# Patient Record
Sex: Female | Born: 1975 | Race: White | Hispanic: No | Marital: Married | Smoking: Never smoker
Health system: Southern US, Community
[De-identification: ages and names within clinical notes are randomized; demographics above are authoritative.]

## PROBLEM LIST (undated history)

## (undated) DIAGNOSIS — N809 Endometriosis, unspecified: Secondary | ICD-10-CM

## (undated) DIAGNOSIS — F32A Depression, unspecified: Secondary | ICD-10-CM

## (undated) DIAGNOSIS — F419 Anxiety disorder, unspecified: Secondary | ICD-10-CM

## (undated) DIAGNOSIS — N941 Unspecified dyspareunia: Secondary | ICD-10-CM

## (undated) DIAGNOSIS — M797 Fibromyalgia: Secondary | ICD-10-CM

## (undated) DIAGNOSIS — I10 Essential (primary) hypertension: Secondary | ICD-10-CM

## (undated) DIAGNOSIS — N920 Excessive and frequent menstruation with regular cycle: Secondary | ICD-10-CM

## (undated) HISTORY — DX: Unspecified dyspareunia: N94.10

## (undated) HISTORY — DX: Excessive and frequent menstruation with regular cycle: N92.0

---

## 2010-04-03 ENCOUNTER — Ambulatory Visit: Payer: Self-pay | Admitting: Family Medicine

## 2011-03-19 ENCOUNTER — Ambulatory Visit: Payer: Self-pay | Admitting: Internal Medicine

## 2011-08-30 ENCOUNTER — Ambulatory Visit: Payer: Self-pay

## 2011-12-05 ENCOUNTER — Ambulatory Visit: Payer: Self-pay

## 2011-12-27 ENCOUNTER — Ambulatory Visit: Payer: Self-pay

## 2012-07-28 ENCOUNTER — Ambulatory Visit: Payer: Self-pay | Admitting: Internal Medicine

## 2012-07-28 LAB — COMPREHENSIVE METABOLIC PANEL
Anion Gap: 7 (ref 7–16)
BUN: 7 mg/dL (ref 7–18)
Calcium, Total: 9.1 mg/dL (ref 8.5–10.1)
Co2: 27 mmol/L (ref 21–32)
Creatinine: 0.89 mg/dL (ref 0.60–1.30)
EGFR (African American): >60
EGFR (Non-African Amer.): >60
SGOT(AST): 20 U/L (ref 15–37)
SGPT (ALT): 18 U/L (ref 12–78)
Total Protein: 7.5 g/dL (ref 6.4–8.2)

## 2012-07-28 LAB — CBC WITH DIFFERENTIAL/PLATELET
Basophil #: 0.1 10*3/uL (ref 0.0–0.1)
Basophil %: 0.6 %
Eosinophil #: 0.2 10*3/uL (ref 0.0–0.7)
HCT: 40.3 % (ref 35.0–47.0)
HGB: 13.6 g/dL (ref 12.0–16.0)
MCH: 31.2 pg (ref 26.0–34.0)
MCHC: 33.7 g/dL (ref 32.0–36.0)
Monocyte #: 0.5 x10 3/mm (ref 0.2–0.9)
Monocyte %: 6.3 %
Neutrophil #: 5 10*3/uL (ref 1.4–6.5)
Neutrophil %: 58.3 %
RBC: 4.35 10*6/uL (ref 3.80–5.20)
RDW: 13.1 % (ref 11.5–14.5)

## 2012-07-28 LAB — URINALYSIS, COMPLETE
Bilirubin,UR: NEGATIVE
Ketone: NEGATIVE
Ph: 6 (ref 4.5–8.0)
Protein: NEGATIVE
Specific Gravity: 1.005 (ref 1.003–1.030)

## 2012-07-29 LAB — URINE CULTURE

## 2014-08-29 ENCOUNTER — Ambulatory Visit: Payer: Self-pay

## 2014-08-29 LAB — OCCULT BLOOD X 1 CARD TO LAB, STOOL: Occult Blood, Feces: NEGATIVE

## 2014-09-07 ENCOUNTER — Ambulatory Visit: Payer: Self-pay

## 2014-09-09 ENCOUNTER — Ambulatory Visit: Payer: Self-pay

## 2014-11-22 DIAGNOSIS — N83209 Unspecified ovarian cyst, unspecified side: Secondary | ICD-10-CM

## 2014-11-22 HISTORY — DX: Unspecified ovarian cyst, unspecified side: N83.209

## 2017-02-01 ENCOUNTER — Ambulatory Visit
Admission: EM | Admit: 2017-02-01 | Discharge: 2017-02-01 | Disposition: A | Discharge status: Home / Self Care | Payer: Self-pay | Attending: Family Medicine | Admitting: Family Medicine

## 2017-02-01 ENCOUNTER — Encounter: Payer: Self-pay | Admitting: *Deleted

## 2017-02-01 DIAGNOSIS — J0100 Acute maxillary sinusitis, unspecified: Secondary | ICD-10-CM

## 2017-02-01 DIAGNOSIS — J0110 Acute frontal sinusitis, unspecified: Secondary | ICD-10-CM

## 2017-02-01 DIAGNOSIS — H9203 Otalgia, bilateral: Secondary | ICD-10-CM

## 2017-02-01 HISTORY — DX: Fibromyalgia: M79.7

## 2017-02-01 HISTORY — DX: Anxiety disorder, unspecified: F41.9

## 2017-02-01 HISTORY — DX: Endometriosis, unspecified: N80.9

## 2017-02-01 MED ORDER — AMOXICILLIN-POT CLAVULANATE 400-57 MG/5ML PO SUSR
880.0000 mg | Freq: Two times a day (BID) | ORAL | 0 refills | Status: AC
Start: 2017-02-03 — End: 2017-02-13

## 2017-02-01 NOTE — ED Provider Notes (Signed)
[facility] ____________________________________________  Time seen: Approximately 1:10 PM  I have reviewed the triage vital signs and the nursing notes.   HISTORY  Chief Complaint Otalgia; Cough; and Headache  HPI Elizabeth Lucas is a 41 y.o. female presents with spouse at bedside for complaints of 5-6 days of runny nose, nasal congestion, bilateral ear discomfort, mild sore throat, and sinus pressure. States some post nasal drainage. Reports some chills sensation, denies and fevers. Denies known sick contacts. Denies seasonal allergies. Reports some muffled hearing, denies tinnitus or ear drainage. Reports symptoms unresolved with over-the-counter TheraFlu and Mucinex.  Denies chest pain, shortness of breath, abdominal pain, dysuria, extremity pain, extremity swelling or rash. Denies recent sickness. Denies recent antibiotic use.   Patient's last menstrual period was 01/16/2017.: Denies pregnancy.  Rayetta HumphreyGeorge, Sionne A, MD: PCP   Past Medical History:  Diagnosis Date  . Anxiety   . Endometriosis   . Fibromyalgia     There are no active problems to display for this patient.   History reviewed. No pertinent surgical history.   No current facility-administered medications for this encounter.   Current Outpatient Prescriptions:  .  [START ON 02/03/2017] amoxicillin-clavulanate (AUGMENTIN) 400-57 MG/5ML suspension, Take 11 mLs (880 mg total) by mouth 2 (two) times daily., Disp: 220 mL, Rfl: 0  Allergies Percocet [oxycodone-acetaminophen]  History reviewed. No pertinent family history.  Social History Social History  Substance Use Topics  . Smoking status: Never Smoker  . Smokeless tobacco: Never Used  . Alcohol use Yes    Review of Systems Constitutional: As above.  Eyes: No visual changes. ENT: As above. Cardiovascular: Denies chest pain. Respiratory: Denies shortness of breath. Gastrointestinal: No abdominal pain.  No nausea, no vomiting.  No  diarrhea.  No constipation. Genitourinary: Negative for dysuria. Musculoskeletal: Negative for back pain. Skin: Negative for rash. Neurological: Negative for headaches, focal weakness or numbness.  10-point ROS otherwise negative.  ____________________________________________   PHYSICAL EXAM:  VITAL SIGNS: ED Triage Vitals  Enc Vitals Group     BP 02/01/17 1205 137/75     Pulse Rate 02/01/17 1205 (!) 101     Resp 02/01/17 1205 16     Temp 02/01/17 1205 98.3 F (36.8 C)     Temp Source 02/01/17 1205 Oral     SpO2 02/01/17 1205 98 %     Weight 02/01/17 1207 240 lb (108.9 kg)     Height 02/01/17 1207 5\' 4"  (1.626 m)     Head Circumference --      Peak Flow --      Pain Score 02/01/17 1212 6     Pain Loc --      Pain Edu? --      Excl. in GC? --     Constitutional: Alert and oriented. Well appearing and in no acute distress. Eyes: Conjunctivae are normal. PERRL. EOMI. Head: Atraumatic.Mild to moderate tenderness to palpation bilateral frontal and maxillary sinuses. No swelling. No erythema.   Ears: no erythema, normal TMs bilaterally. No surrounding tenderness, erythema or swelling bilaterally.  Nose: nasal congestion with bilateral nasal turbinate erythema and edema.   Mouth/Throat: Mucous membranes are moist.  Oropharynx non-erythematous.No tonsillar swelling or exudate.  Neck: No stridor.  No cervical spine tenderness to palpation. Hematological/Lymphatic/Immunilogical: No cervical lymphadenopathy. Cardiovascular: Normal rate, regular rhythm. Grossly normal heart sounds.  Good peripheral circulation. Respiratory: Normal respiratory effort.  No retractions.No wheezes, rales or rhonchi. Good air movement.  Gastrointestinal: Soft and nontender. No distention.  Musculoskeletal: Steady  gait. No cervical, thoracic or lumbar tenderness to palpation.  Neurologic:  Normal speech and language. No gait instability. Skin:  Skin is warm, dry and intact. No rash noted. Psychiatric:  Mood and affect are normal. Speech and behavior are normal.   ___________________________________________   LABS (all labs ordered are listed, but only abnormal results are displayed)  Labs Reviewed - No data to display  PROCEDURES Procedures   INITIAL IMPRESSION / ASSESSMENT AND PLAN / ED COURSE  Pertinent labs & imaging results that were available during my care of the patient were reviewed by me and considered in my medical decision making (see chart for details).   Well-appearing patient. No acute distress. Discussed evaluation of quick strep, patient declined. Discussed with patient antibiotic usage. Discussed the patient suspect viral sinusitis in counseled regarding supportive care, nasal saline rinses and over-the-counter Sudafed. Discussed with patient if symptoms continue for another 2-3 days, consideration of antibiotic use and Rx for Augmentin postdated given. Patient agreed to plan and states she will attempt supportive care. Discussed follow-up for reevaluation for any worsening concerns.Discussed indication, risks and benefits of medications with patient.  Discussed follow up with Primary care physician this week. Discussed follow up and return parameters including no resolution or any worsening concerns. Patient verbalized understanding and agreed to plan.   ____________________________________________   FINAL CLINICAL IMPRESSION(S) / ED DIAGNOSES  Final diagnoses:  Acute frontal sinusitis, recurrence not specified  Acute maxillary sinusitis, recurrence not specified  Ear pain, bilateral     Discharge Medication List as of 02/01/2017  1:21 PM    START taking these medications   Details  amoxicillin-clavulanate (AUGMENTIN) 400-57 MG/5ML suspension Take 11 mLs (880 mg total) by mouth 2 (two) times daily., Starting Thu 02/03/2017, Until Sun 02/13/2017, Normal        Note: This dictation was prepared with Dragon dictation along with smaller phrase technology. Any  transcriptional errors that result from this process are unintentional.         Renford Dills, NP 02/01/17 1402

## 2017-02-01 NOTE — Discharge Instructions (Signed)
Take medication as prescribed. Rest. Drink plenty of fluids.  ° °Follow up with your primary care physician this week as needed. Return to Urgent care for new or worsening concerns.  ° °

## 2017-02-01 NOTE — ED Triage Notes (Signed)
Patient started having symptoms of headache, cough, bilateral ear pain, and nasal congestion 5 days ago.

## 2017-04-13 ENCOUNTER — Other Ambulatory Visit: Payer: Self-pay | Admitting: Family Medicine

## 2017-04-13 DIAGNOSIS — Z1231 Encounter for screening mammogram for malignant neoplasm of breast: Secondary | ICD-10-CM

## 2017-04-20 ENCOUNTER — Ambulatory Visit
Admission: RE | Admit: 2017-04-20 | Discharge: 2017-04-20 | Disposition: A | Discharge status: Home / Self Care | Payer: BLUE CROSS/BLUE SHIELD | Source: Ambulatory Visit | Attending: Family Medicine | Admitting: Family Medicine

## 2017-04-20 DIAGNOSIS — Z1231 Encounter for screening mammogram for malignant neoplasm of breast: Secondary | ICD-10-CM | POA: Insufficient documentation

## 2017-11-01 ENCOUNTER — Other Ambulatory Visit: Payer: Self-pay

## 2017-11-01 ENCOUNTER — Ambulatory Visit
Admission: EM | Admit: 2017-11-01 | Discharge: 2017-11-01 | Disposition: A | Discharge status: Home / Self Care | Payer: BLUE CROSS/BLUE SHIELD | Attending: Family Medicine | Admitting: Family Medicine

## 2017-11-01 ENCOUNTER — Encounter: Payer: Self-pay | Admitting: *Deleted

## 2017-11-01 DIAGNOSIS — J029 Acute pharyngitis, unspecified: Secondary | ICD-10-CM

## 2017-11-01 DIAGNOSIS — R05 Cough: Secondary | ICD-10-CM

## 2017-11-01 DIAGNOSIS — J069 Acute upper respiratory infection, unspecified: Secondary | ICD-10-CM | POA: Diagnosis not present

## 2017-11-01 LAB — RAPID STREP SCREEN (MED CTR MEBANE ONLY): STREPTOCOCCUS, GROUP A SCREEN (DIRECT): NEGATIVE

## 2017-11-01 MED ORDER — BENZONATATE 200 MG PO CAPS: ORAL_CAPSULE | ORAL | 0 refills | Status: DC

## 2017-11-01 MED ORDER — ALBUTEROL SULFATE HFA 108 (90 BASE) MCG/ACT IN AERS: 1.0000 | INHALATION_SPRAY | Freq: Four times a day (QID) | RESPIRATORY_TRACT | 0 refills | Status: DC | PRN

## 2017-11-01 MED ORDER — HYDROCOD POLST-CPM POLST ER 10-8 MG/5ML PO SUER: 5.0000 mL | Freq: Two times a day (BID) | ORAL | 0 refills | Status: DC

## 2017-11-01 MED ORDER — FLUTICASONE PROPIONATE 50 MCG/ACT NA SUSP: 2.0000 | Freq: Every day | NASAL | 0 refills | Status: DC

## 2017-11-01 NOTE — Discharge Instructions (Signed)
Use of Flonase daily for the next 3 weeks.   If You run high fevers or have a productive cough  with shortness of breath return to our clinic or see your primary care physician

## 2017-11-01 NOTE — ED Triage Notes (Signed)
Patient started having symptoms of sore throat, cough, nasal congestion, and SOB 5 days ago.

## 2017-11-01 NOTE — ED Provider Notes (Signed)
[facility]    CSN: [id number] Arrival date & time: 11/01/17  1035     History   Chief Complaint Chief Complaint  Patient presents with  . Cough    APPT  . Nasal Congestion  . Sore Throat    HPI Elizabeth Lucas is a 41 y.o. female.   HPI  41 year old female presents with symptoms of sore throat cough nasal congestion and shortness of breath that began 5 days ago.  Had a fever but has had some chills.  Non-smoker.  She states that the cough is nonproductive at the present time.  Been taking over-the-counter medications which have not been relieving her symptoms.    Past Medical History:  Diagnosis Date  . Anxiety   . Endometriosis   . Fibromyalgia     There are no active problems to display for this patient.   History reviewed. No pertinent surgical history.  OB History    No data available       Home Medications    Prior to Admission medications   Medication Sig Start Date End Date Taking? Authorizing Provider  albuterol (PROVENTIL HFA;VENTOLIN HFA) 108 (90 Base) MCG/ACT inhaler Inhale 1-2 puffs into the lungs every 6 (six) hours as needed for wheezing or shortness of breath. Use with spacer 11/01/17   Lutricia Feiloemer, Derald Lorge P, PA-C  benzonatate (TESSALON) 200 MG capsule Take one cap TID PRN cough 11/01/17   Lutricia Feiloemer, Fayette Gasner P, PA-C  chlorpheniramine-HYDROcodone [facility](TUSSIONEX PENNKINETIC ER) 10-8 MG/5ML SUER Take 5 mLs by mouth 2 (two) times daily. 11/01/17   Lutricia Feiloemer, Clemente Dewey P, PA-C  fluticasone (FLONASE) 50 MCG/ACT nasal spray Place 2 sprays into both nostrils daily. 11/01/17   Lutricia Feiloemer, Allon Costlow P, PA-C    Family History Family History  Problem Relation Age of Onset  . Breast cancer Neg Hx     Social History Social History   Tobacco Use  . Smoking status: Never Smoker  . Smokeless tobacco: Never Used  Substance Use Topics  . Alcohol use: Yes  . Drug use: No     Allergies   Percocet [oxycodone-acetaminophen]   Review of  Systems Review of Systems  Constitutional: Positive for activity change and chills. Negative for fatigue and fever.  HENT: Positive for congestion, postnasal drip, rhinorrhea, sinus pressure and sinus pain.   Respiratory: Positive for cough and shortness of breath.   All other systems reviewed and are negative.    Physical Exam Triage Vital Signs ED Triage Vitals  Enc Vitals Group     BP 11/01/17 1047 116/68     Pulse Rate 11/01/17 1047 93     Resp 11/01/17 1047 18     Temp 11/01/17 1047 98.6 F (37 C)     Temp Source 11/01/17 1047 Oral     SpO2 11/01/17 1047 98 %     Weight 11/01/17 1048 250 lb (113.4 kg)     Height 11/01/17 1048 5\' 4"  (1.626 m)     Head Circumference --      Peak Flow --      Pain Score 11/01/17 1050 0     Pain Loc --      Pain Edu? --      Excl. in GC? --    No data found.  Updated Vital Signs BP 116/68 (BP Location: Left Arm)   Pulse 93   Temp 98.6 F (37 C) (Oral)   Resp 18   Ht 5\' 4"  (1.626 m)   Wt 250 lb (113.4  kg)   LMP 10/19/2017   SpO2 98%   BMI 42.91 kg/m   Visual Acuity Right Eye Distance:   Left Eye Distance:   Bilateral Distance:    Right Eye Near:   Left Eye Near:    Bilateral Near:     Physical Exam  Constitutional: She is oriented to person, place, and time. She appears well-developed and well-nourished.  HENT:  Head: Normocephalic.  Right Ear: Hearing and tympanic membrane normal. No middle ear effusion.  Left Ear: Hearing and tympanic membrane normal.  No middle ear effusion.  Mouth/Throat: Oropharynx is clear and moist and mucous membranes are normal. Tonsils are 0 on the right. Tonsils are 0 on the left. No tonsillar exudate.  Eyes: Pupils are equal, round, and reactive to light.  Neck: Normal range of motion.  Pulmonary/Chest: Effort normal and breath sounds normal.  Lymphadenopathy:    She has no cervical adenopathy.  Neurological: She is alert and oriented to person, place, and time.  Skin: Skin is warm and  dry.  Psychiatric: She has a normal mood and affect. Her behavior is normal.  Nursing note and vitals reviewed.    UC Treatments / Results  Labs (all labs ordered are listed, but only abnormal results are displayed) Labs Reviewed  RAPID STREP SCREEN (NOT AT [facility])  CULTURE, GROUP A STREP [facility]    EKG  EKG Interpretation None       Radiology No results found.  Procedures Procedures (including critical care time)  Medications Ordered in UC Medications - No data to display   Initial Impression / Assessment and Plan / UC Course  I have reviewed the triage vital signs and the nursing notes.  Pertinent labs & imaging results that were available during my care of the patient were reviewed by me and considered in my medical decision making (see chart for details).     Plan: 1. Test/x-ray results and diagnosis reviewed with patient 2. rx as per orders; risks, benefits, potential side effects reviewed with patient 3. Recommend supportive treatment with rest and fluids.  Flonase daily for the next 3 weeks.  If you develop high fevers productive cough shortness of breath follow-up with primary care or return to our clinic.  Use albuterol inhaler with spacer for shortness of breath 4. F/u prn if symptoms worsen or don't improve   Final Clinical Impressions(s) / UC Diagnoses   Final diagnoses:  Upper respiratory tract infection, unspecified type    ED Discharge Orders        Ordered    fluticasone (FLONASE) 50 MCG/ACT nasal spray  Daily     11/01/17 1159    chlorpheniramine-HYDROcodone (TUSSIONEX PENNKINETIC ER) 10-8 MG/5ML SUER  2 times daily     11/01/17 1159    benzonatate (TESSALON) 200 MG capsule     11/01/17 1159    albuterol (PROVENTIL HFA;VENTOLIN HFA) 108 (90 Base) MCG/ACT inhaler  Every 6 hours PRN    Comments:  Provide spacer and instructions to patient   11/01/17 1159       Controlled Substance Prescriptions [place] Controlled Substance Registry consulted?  Not Applicable   Lutricia FeilRoemer, Micaylah Bertucci P, PA-C 11/01/17 1208

## 2017-11-04 LAB — CULTURE, GROUP A STREP (THRC)

## 2017-12-15 ENCOUNTER — Other Ambulatory Visit: Payer: Self-pay

## 2017-12-15 ENCOUNTER — Encounter: Payer: Self-pay | Admitting: Emergency Medicine

## 2017-12-15 ENCOUNTER — Ambulatory Visit
Admission: EM | Admit: 2017-12-15 | Discharge: 2017-12-15 | Disposition: A | Discharge status: Home / Self Care | Payer: BLUE CROSS/BLUE SHIELD | Attending: Emergency Medicine | Admitting: Emergency Medicine

## 2017-12-15 DIAGNOSIS — B9689 Other specified bacterial agents as the cause of diseases classified elsewhere: Secondary | ICD-10-CM | POA: Diagnosis not present

## 2017-12-15 DIAGNOSIS — N771 Vaginitis, vulvitis and vulvovaginitis in diseases classified elsewhere: Secondary | ICD-10-CM

## 2017-12-15 DIAGNOSIS — N760 Acute vaginitis: Secondary | ICD-10-CM

## 2017-12-15 LAB — URINALYSIS, COMPLETE (UACMP) WITH MICROSCOPIC
Bilirubin Urine: NEGATIVE
Glucose, UA: NEGATIVE mg/dL
Hgb urine dipstick: NEGATIVE
KETONES UR: NEGATIVE mg/dL
LEUKOCYTES UA: NEGATIVE
Nitrite: NEGATIVE
PROTEIN: NEGATIVE mg/dL
RBC / HPF: NONE SEEN RBC/hpf (ref 0–5)
Specific Gravity, Urine: 1.01 (ref 1.005–1.030)
pH: 7 (ref 5.0–8.0)

## 2017-12-15 LAB — WET PREP, GENITAL
Sperm: NONE SEEN
TRICH WET PREP: NONE SEEN
Yeast Wet Prep HPF POC: NONE SEEN

## 2017-12-15 MED ORDER — NYSTATIN-TRIAMCINOLONE 100000-0.1 UNIT/GM-% EX CREA: 1.0000 "application " | TOPICAL_CREAM | Freq: Two times a day (BID) | CUTANEOUS | 0 refills | Status: AC

## 2017-12-15 MED ORDER — METRONIDAZOLE 500 MG PO TABS: 500.0000 mg | ORAL_TABLET | Freq: Two times a day (BID) | ORAL | 0 refills | Status: AC

## 2017-12-15 NOTE — Discharge Instructions (Signed)
I am not starting you on antibiotics for a urinary tract infection until we get the culture results back because I am starting you on 2 new medicines for your symptoms.  We will contact you and start you on the appropriate antibiotics if your urine culture consult positive for UTI.  This may take several days.  You also may call up here and get your results.  The meantime, finish the Flagyl unless your doctor tells you to stop.  Try the triamcinolone/nystatin cream.  Apply twice a day sparingly for a week.  May stop earlier than this if your symptoms resolve.

## 2017-12-15 NOTE — ED Triage Notes (Signed)
Patient c/o increase in urinary urgency and frequency for the past 3-4 days.  Patient reports some urinary pressure.

## 2017-12-15 NOTE — ED Provider Notes (Addendum)
HPI  SUBJECTIVE:  Elizabeth Lucas is a 42 y.o. female who presents with 3-4 days of urinary frequency, urgency, low abdominal pressure when urinating and some thereafter, and vulvar irritation.  She states that it feels as if her vulva are inflamed.  She tried increasing fluids and putting an ice cold washcloth on her genitals last night with some improvement in her symptoms.  No aggravating factors.  She denies fevers, body aches, abdominal, back, pelvic pain.  No dysuria, cloudy odorous urine, hematuria.  No vaginal itching, burning.  No genital rash, vaginal odor, bleeding, discharge.  She is in a long-term monogamous relationship with her husband who is asymptomatic.  STDs are not a concern today.  No antibiotics in the past month.  No perfumed soaps or body washes.  Denies using sanitary pads.  States that she is using a new toilet paper.  No antipyretic in the past 6-8 hours.  She has a Past medical history of fibromyalgia, pyelonephritis 20 years ago. no history of nephrolithiasis.  Also history of vaginal yeast infections.  No history of gonorrhea, chlamydia, herpes, HIV, syphilis, Trichomonas, BV, diabetes, hypertension.  LMP: 3 weeks ago.  She denies the possibility of being pregnant.  ZOX:WRUEAV, Marylin Crosby, MD   Past Medical History:  Diagnosis Date  . Anxiety   . Endometriosis   . Fibromyalgia     History reviewed. No pertinent surgical history.  Family History  Problem Relation Age of Onset  . Breast cancer Neg Hx     Social History   Tobacco Use  . Smoking status: Never Smoker  . Smokeless tobacco: Never Used  Substance Use Topics  . Alcohol use: Yes  . Drug use: No    No current facility-administered medications for this encounter.   Current Outpatient Medications:  .  fluticasone (FLONASE) 50 MCG/ACT nasal spray, Place 2 sprays into both nostrils daily., Disp: 16 g, Rfl: 0 .  metroNIDAZOLE (FLAGYL) 500 MG tablet, Take 1 tablet (500 mg total) by mouth 2 (two)  times daily for 7 days., Disp: 14 tablet, Rfl: 0 .  nystatin-triamcinolone (MYCOLOG II) cream, Apply 1 application topically 2 (two) times daily for 7 days. Apply sparingly, Disp: 15 g, Rfl: 0  Allergies  Allergen Reactions  . Percocet [Oxycodone-Acetaminophen] Nausea And Vomiting     ROS  As noted in HPI.   Physical Exam  BP 131/78 (BP Location: Left Arm)   Pulse (!) 106   Temp 98.2 F (36.8 C) (Oral)   Resp 16   Ht 5\' 4"  (1.626 m)   Wt 250 lb (113.4 kg)   LMP 11/24/2017 (Approximate)   SpO2 100%   BMI 42.91 kg/m   Constitutional: Well developed, well nourished, no acute distress Eyes:  EOMI, conjunctiva normal bilaterally HENT: Normocephalic, atraumatic,mucus membranes moist Respiratory: Normal inspiratory effort Cardiovascular: Normal rate GI: nondistended.  Mild suprapubic tenderness.  No flank tenderness. Back: No CVAT GU: Patient declined skin: No rash, skin intact Musculoskeletal: no deformities Neurologic: Alert & oriented x 3, no focal neuro deficits Psychiatric: Speech and behavior appropriate   ED Course   Medications - No data to display  Orders Placed This Encounter  Procedures  . Wet prep, genital    Standing Status:   Standing    Number of Occurrences:   1  . Urine culture    Standing Status:   Standing    Number of Occurrences:   1  . Urinalysis, Complete w Microscopic    Standing Status:  Standing    Number of Occurrences:   1    Results for orders placed or performed during the hospital encounter of 12/15/17 (from the past 24 hour(s))  Urinalysis, Complete w Microscopic     Status: Abnormal   Collection Time: 12/15/17 10:16 AM  Result Value Ref Range   Color, Urine YELLOW YELLOW   APPearance CLEAR CLEAR   Specific Gravity, Urine 1.010 1.005 - 1.030   pH 7.0 5.0 - 8.0   Glucose, UA NEGATIVE NEGATIVE mg/dL   Hgb urine dipstick NEGATIVE NEGATIVE   Bilirubin Urine NEGATIVE NEGATIVE   Ketones, ur NEGATIVE NEGATIVE mg/dL   Protein,  ur NEGATIVE NEGATIVE mg/dL   Nitrite NEGATIVE NEGATIVE   Leukocytes, UA NEGATIVE NEGATIVE   Squamous Epithelial / LPF 6-30 (A) NONE SEEN   WBC, UA 0-5 0 - 5 WBC/hpf   RBC / HPF NONE SEEN 0 - 5 RBC/hpf   Bacteria, UA FEW (A) NONE SEEN  Wet prep, genital     Status: Abnormal   Collection Time: 12/15/17 11:02 AM  Result Value Ref Range   Yeast Wet Prep HPF POC NONE SEEN NONE SEEN   Trich, Wet Prep NONE SEEN NONE SEEN   Clue Cells Wet Prep HPF POC PRESENT (A) NONE SEEN   WBC, Wet Prep HPF POC FEW (A) NONE SEEN   Sperm NONE SEEN    No results found.  ED Clinical Impression  Bacterial vaginosis  Vaginitis and vulvovaginitis   ED Assessment/Plan   Patient has BV.  Will treat this with Flagyl.  Suspect based on history that she has some sort of irritant dermatitis of the vulva, but she declined a pelvic exam.  Feel that she is low risk enough for STI's that this is reasonable.  Will try some nystatin/triamcinolone ointment.  Sending urine off for culture because of the suprapubic tenderness to confirm absence of a UTI.  Deferring antibiotic treatment until the culture is resulted.  She will follow-up with her primary care physician as needed.   Discussed labs,MDM, plan and followup with patient. patient agrees with plan.   Meds ordered this encounter  Medications  . metroNIDAZOLE (FLAGYL) 500 MG tablet    Sig: Take 1 tablet (500 mg total) by mouth 2 (two) times daily for 7 days.    Dispense:  14 tablet    Refill:  0  . nystatin-triamcinolone (MYCOLOG II) cream    Sig: Apply 1 application topically 2 (two) times daily for 7 days. Apply sparingly    Dispense:  15 g    Refill:  0    *This clinic note was created using Scientist, clinical (histocompatibility and immunogenetics)Dragon dictation software. Therefore, there may be occasional mistakes despite careful proofreading.   ?   Domenick GongMortenson, Chattie Greeson, MD 12/15/17 1142    Domenick GongMortenson, Bruin Bolger, MD 12/15/17 [phone]

## 2017-12-17 LAB — URINE CULTURE: Culture: 10000 — AB

## 2018-01-10 ENCOUNTER — Encounter: Payer: Self-pay | Admitting: *Deleted

## 2018-01-10 ENCOUNTER — Ambulatory Visit
Admission: EM | Admit: 2018-01-10 | Discharge: 2018-01-10 | Disposition: A | Discharge status: Home / Self Care | Payer: BLUE CROSS/BLUE SHIELD | Attending: Emergency Medicine | Admitting: Emergency Medicine

## 2018-01-10 ENCOUNTER — Other Ambulatory Visit: Payer: Self-pay

## 2018-01-10 DIAGNOSIS — J029 Acute pharyngitis, unspecified: Secondary | ICD-10-CM

## 2018-01-10 DIAGNOSIS — R1030 Lower abdominal pain, unspecified: Secondary | ICD-10-CM

## 2018-01-10 DIAGNOSIS — R350 Frequency of micturition: Secondary | ICD-10-CM

## 2018-01-10 DIAGNOSIS — R10819 Abdominal tenderness, unspecified site: Secondary | ICD-10-CM | POA: Diagnosis not present

## 2018-01-10 LAB — URINALYSIS, COMPLETE (UACMP) WITH MICROSCOPIC
Bilirubin Urine: NEGATIVE
Glucose, UA: NEGATIVE mg/dL
Ketones, ur: NEGATIVE mg/dL
Nitrite: NEGATIVE
Protein, ur: NEGATIVE mg/dL
Specific Gravity, Urine: <1.005 — ABNORMAL LOW (ref 1.005–1.030)
pH: 7 (ref 5.0–8.0)

## 2018-01-10 MED ORDER — CEPHALEXIN 250 MG/5ML PO SUSR: 500.0000 mg | Freq: Two times a day (BID) | ORAL | 0 refills | Status: AC

## 2018-01-10 MED ORDER — FLUCONAZOLE 150 MG PO TABS: 150.0000 mg | ORAL_TABLET | Freq: Once | ORAL | 0 refills | Status: AC

## 2018-01-10 NOTE — ED Provider Notes (Signed)
[facility]    CSN: [id number] Arrival date & time: 01/10/18  0905     History   Chief Complaint Chief Complaint  Patient presents with  . Urinary Urgency    HPI Elizabeth Lucas is a 42 y.o. female.   42 year old female presents with urinary frequency and urgency that started yesterday. Also having significant left lower quadrant and suprapubic pain last night. Took Tylenol and used a heating pad which did help. Also has noticed urine has a foul odor but denies any fever, hematuria or unusual vaginal discharge. Also started having a sore throat 2 days ago with chills. Denies any congestion, sinus pain, cough, or low back pain. She was seen almost 1 month ago here at Urgent Care for dysuria and vulvar irritation and was dx with BV. Took Flagyl which did help symptoms- although had a difficult time with pills since unable to swallow pills well. Also had urine culture performed which was positive for 10,000 colonies of E. Coli- sensitive to Rocephin and Macrobid. Did not receive any additional antibiotics at the time since symptoms were improving. Was negative for yeast and Trich. Declined STD testing at the time. 1 partner (husband) and LMP 1 month ago and regular. Today symptoms are more "urinary" compared to "vaginal" per patient. No other current health issues and takes no daily medication.    The history is provided by the patient.    Past Medical History:  Diagnosis Date  . Anxiety   . Endometriosis   . Fibromyalgia     There are no active problems to display for this patient.   History reviewed. No pertinent surgical history.  OB History    No data available       Home Medications    Prior to Admission medications   Medication Sig Start Date End Date Taking? Authorizing Provider  cephALEXin (KEFLEX) 250 MG/5ML suspension Take 10 mLs (500 mg total) by mouth 2 (two) times daily for 7 days. 01/10/18 01/17/18  Sudie Grumbling, NP    Family  History Family History  Problem Relation Age of Onset  . Healthy Mother   . Breast cancer Neg Hx     Social History Social History   Tobacco Use  . Smoking status: Never Smoker  . Smokeless tobacco: Never Used  Substance Use Topics  . Alcohol use: Yes  . Drug use: No     Allergies   Percocet [oxycodone-acetaminophen]   Review of Systems Review of Systems  Constitutional: Positive for appetite change, chills and fatigue. Negative for activity change and fever.  HENT: Positive for sore throat. Negative for congestion, ear discharge, ear pain, mouth sores, postnasal drip, sinus pressure, sinus pain and trouble swallowing.   Eyes: Negative for pain, discharge, redness and itching.  Respiratory: Negative for cough, chest tightness, shortness of breath and wheezing.   Gastrointestinal: Positive for abdominal pain. Negative for diarrhea, nausea and vomiting.  Genitourinary: Positive for decreased urine volume, dysuria, frequency and urgency. Negative for difficulty urinating, flank pain, hematuria, vaginal bleeding and vaginal discharge.  Musculoskeletal: Negative for arthralgias, back pain, myalgias, neck pain and neck stiffness.  Skin: Negative for rash and wound.  Allergic/Immunologic: Negative for immunocompromised state.  Neurological: Positive for headaches. Negative for dizziness, seizures, syncope, weakness and light-headedness.  Hematological: Negative for adenopathy. Does not bruise/bleed easily.     Physical Exam Triage Vital Signs ED Triage Vitals  Enc Vitals Group     BP 01/10/18 0923 117/66  Pulse Rate 01/10/18 0923 (!) 109     Resp 01/10/18 0923 16     Temp 01/10/18 0923 97.6 F (36.4 C)     Temp Source 01/10/18 0923 Oral     SpO2 01/10/18 0923 97 %     Weight --      Height --      Head Circumference --      Peak Flow --      Pain Score 01/10/18 0926 0     Pain Loc --      Pain Edu? --      Excl. in GC? --    No data found.  Updated Vital  Signs BP 117/66 (BP Location: Left Arm)   Pulse (!) 109   Temp 97.6 F (36.4 C) (Oral)   Resp 16   LMP 12/11/2017   SpO2 97%   Visual Acuity Right Eye Distance:   Left Eye Distance:   Bilateral Distance:    Right Eye Near:   Left Eye Near:    Bilateral Near:     Physical Exam  Constitutional: She is oriented to person, place, and time. She appears well-developed and well-nourished. No distress.  HENT:  Head: Normocephalic and atraumatic.  Right Ear: Hearing, tympanic membrane, external ear and ear canal normal.  Left Ear: Hearing, tympanic membrane, external ear and ear canal normal.  Nose: Nose normal. Right sinus exhibits no maxillary sinus tenderness and no frontal sinus tenderness. Left sinus exhibits no maxillary sinus tenderness and no frontal sinus tenderness.  Mouth/Throat: Uvula is midline and mucous membranes are normal. Posterior oropharyngeal erythema present.  Eyes: Conjunctivae and EOM are normal.  Neck: Normal range of motion. Neck supple.  Cardiovascular: Regular rhythm and normal heart sounds. Tachycardia present.  No murmur heard. Pulmonary/Chest: Effort normal and breath sounds normal. No respiratory distress. She has no decreased breath sounds. She has no wheezes. She has no rhonchi.  Abdominal: Soft. Normal appearance and bowel sounds are normal. She exhibits no distension and no mass. There is tenderness in the right lower quadrant, suprapubic area and left lower quadrant. There is no rigidity, no rebound, no guarding and no CVA tenderness.  Genitourinary:  Genitourinary Comments: Patient declined pelvic exam today  Musculoskeletal: Normal range of motion.  Lymphadenopathy:    She has cervical adenopathy.       Right cervical: Superficial cervical adenopathy present.       Left cervical: Superficial cervical adenopathy present.  Neurological: She is alert and oriented to person, place, and time.  Skin: Skin is warm and dry. No rash noted.  Psychiatric:  She has a normal mood and affect. Her behavior is normal. Judgment and thought content normal.     UC Treatments / Results  Labs (all labs ordered are listed, but only abnormal results are displayed) Labs Reviewed  URINALYSIS, COMPLETE (UACMP) WITH MICROSCOPIC - Abnormal; Notable for the following components:      Result Value   Color, Urine STRAW (*)    Specific Gravity, Urine <1.005 (*)    Hgb urine dipstick TRACE (*)    Leukocytes, UA MODERATE (*)    Squamous Epithelial / LPF 6-30 (*)    Bacteria, UA RARE (*)    All other components within normal limits  URINE CULTURE    EKG  EKG Interpretation None       Radiology No results found.  Procedures Procedures (including critical care time)  Medications Ordered in UC Medications - No data to display  Initial Impression / Assessment and Plan / UC Course  I have reviewed the triage vital signs and the nursing notes.  Pertinent labs & imaging results that were available during my care of the patient were reviewed by me and considered in my medical decision making (see chart for details).    Reviewed urinalysis results with patient- may be a UTI. Since requests liquid antibiotic and also having pharyngitis, recommend trial Keflex 500mg  twice a day as directed. Increase fluid intake and may continue Tylenol as needed for pain. May also use Diflucan 150mg  one time if symptoms of yeast vaginitis occur from antibiotic use. Patient declined pelvic exam and further testing for STD's today. If symptoms do not resolve or if they recur within 1 month, she will return for pelvic exam and further testing. Recommend abstain from sexual intercourse for the next 5 days. Follow-up pending urine culture results and with her PCP in 5 to 7 days if not resolving.   Final Clinical Impressions(s) / UC Diagnoses   Final diagnoses:  Urinary frequency  Suprapubic tenderness  Acute pharyngitis, unspecified etiology    ED Discharge Orders         Ordered    cephALEXin (KEFLEX) 250 MG/5ML suspension  2 times daily     01/10/18 1002    fluconazole (DIFLUCAN) 150 MG tablet   Once     01/10/18 1002       Controlled Substance Prescriptions [place] Controlled Substance Registry consulted? Not Applicable   Sudie Grumbling, NP 01/11/18 [phone]

## 2018-01-10 NOTE — Discharge Instructions (Signed)
Recommend start Keflex 500mg  twice a day as directed. Increase fluid intake. May use Diflucan 150mg  one time if symptoms of yeast vaginitis occur. Recommend abstain from sexual intercourse for the next 5 days. Follow-up pending urine culture results and with your PCP in 5 to 7 days if not resolving.

## 2018-01-10 NOTE — ED Triage Notes (Addendum)
PAtient started having symptoms of LLQ abdominal pain and urine odor yesterday. Patient also reports sore throat symptom started 2 days ago.

## 2019-04-10 ENCOUNTER — Other Ambulatory Visit: Payer: Self-pay

## 2019-04-10 ENCOUNTER — Encounter: Payer: Self-pay | Admitting: Obstetrics and Gynecology

## 2019-04-10 ENCOUNTER — Ambulatory Visit (INDEPENDENT_AMBULATORY_CARE_PROVIDER_SITE_OTHER): Payer: BLUE CROSS/BLUE SHIELD | Admitting: Obstetrics and Gynecology

## 2019-04-10 VITALS — BP 128/84 | Ht 64.0 in | Wt 257.0 lb

## 2019-04-10 DIAGNOSIS — N941 Unspecified dyspareunia: Secondary | ICD-10-CM

## 2019-04-10 DIAGNOSIS — N9489 Other specified conditions associated with female genital organs and menstrual cycle: Secondary | ICD-10-CM

## 2019-04-10 DIAGNOSIS — N949 Unspecified condition associated with female genital organs and menstrual cycle: Secondary | ICD-10-CM | POA: Diagnosis not present

## 2019-04-10 DIAGNOSIS — R300 Dysuria: Secondary | ICD-10-CM

## 2019-04-10 LAB — POCT URINALYSIS DIPSTICK
Bilirubin, UA: NEGATIVE
Blood, UA: NEGATIVE
Glucose, UA: NEGATIVE
Ketones, UA: NEGATIVE
Leukocytes, UA: NEGATIVE
Nitrite, UA: NEGATIVE
Protein, UA: NEGATIVE
Spec Grav, UA: 1.015 (ref 1.010–1.025)
Urobilinogen, UA: NEGATIVE E.U./dL — AB
pH, UA: 7 (ref 5.0–8.0)

## 2019-04-10 NOTE — Patient Instructions (Signed)
I value your feedback and entrusting us with your care. If you get a [facility] patient survey, I would appreciate you taking the time to let us know about your experience today. Thank you! 

## 2019-04-10 NOTE — Progress Notes (Signed)
Rayetta Humphrey, MD   Chief Complaint  Patient presents with  . Urinary Tract Infection  . Pelvic Pain    HPI:      [name] is a 43 y.o. No obstetric history on file. who LMP was Patient's last menstrual period was 03/30/2019., presents today for NP> 3 yrs eval of vaginal pain after sex with urinary urgency, lasting 2-3 days. Sx started 11/2017. Pt has decreased lubrication with sex and uses astroglide. Pt then has significant vag burning sx afterwards. Pt uses oil of olay soap (dove sens skin soap irritating to her) on skin but not vaginally, a vaginal pH soap, no dryer sheets, no change in detergent. Pt's husband uses oil of olay soap. Pt has been evaluated many times without identification of cause. Pt has been treated with abx and yeast without sx change. Pt denies any vag, urin sx if not sex active.   Hx of deep dyspareunia for 20 yrs, seen by Dr. Bonney Aid in past. He is concerned about endometriosis and recommended dx lap prn. Pt with LTO cyst in 2016, and recent dx of fibromyalgia and carpal tunnel.   Last pap 12/2016. Pt still has menses, usually Q1-2 months.   Past Medical History:  Diagnosis Date  . Anxiety   . Dyspareunia in female   . Endometriosis   . Fibromyalgia   . Menorrhagia   . Ovarian cyst 2016   LT    History reviewed. No pertinent surgical history.  Family History  Problem Relation Age of Onset  . Healthy Mother   . Colon cancer Maternal Grandmother   . Diabetes type II Maternal Grandfather   . Colon cancer Paternal Grandmother   . Breast cancer Neg Hx     Social History   Socioeconomic History  . Marital status: Married    Spouse name: Not on file  . Number of children: Not on file  . Years of education: Not on file  . Highest education level: Not on file  Occupational History  . Not on file  Social Needs  . Financial resource strain: Not on file  . Food insecurity:    Worry: Not on file    Inability: Not on file  .  Transportation needs:    Medical: Not on file    Non-medical: Not on file  Tobacco Use  . Smoking status: Never Smoker  . Smokeless tobacco: Never Used  Substance and Sexual Activity  . Alcohol use: Yes    Alcohol/week: 3.0 standard drinks    Types: 3 Glasses of wine per week  . Drug use: No  . Sexual activity: Yes    Birth control/protection: None  Lifestyle  . Physical activity:    Days per week: Not on file    Minutes per session: Not on file  . Stress: Not on file  Relationships  . Social connections:    Talks on phone: Not on file    Gets together: Not on file    Attends religious service: Not on file    Active member of club or organization: Not on file    Attends meetings of clubs or organizations: Not on file    Relationship status: Not on file  . Intimate partner violence:    Fear of current or ex partner: Not on file    Emotionally abused: Not on file    Physically abused: Not on file    Forced sexual activity: Not on file  Other Topics Concern  .  Not on file  Social History Narrative  . Not on file    No outpatient medications prior to visit.   No facility-administered medications prior to visit.      ROS:  Review of Systems  Constitutional: Negative for fatigue, fever and unexpected weight change.  Respiratory: Negative for cough, shortness of breath and wheezing.   Cardiovascular: Negative for chest pain, palpitations and leg swelling.  Gastrointestinal: Negative for blood in stool, constipation, diarrhea, nausea and vomiting.  Endocrine: Positive for heat intolerance. Negative for cold intolerance and polyuria.  Genitourinary: Positive for dysuria, frequency and vaginal pain. Negative for dyspareunia, flank pain, genital sores, hematuria, menstrual problem, pelvic pain, urgency, vaginal bleeding and vaginal discharge.  Musculoskeletal: Negative for back pain, joint swelling and myalgias.  Skin: Negative for rash.  Neurological: Negative for  dizziness, syncope, light-headedness, numbness and headaches.  Hematological: Negative for adenopathy.  Psychiatric/Behavioral: Negative for agitation, confusion, sleep disturbance and suicidal ideas. The patient is not nervous/anxious.     OBJECTIVE:   Vitals:  BP 128/84   Ht 5\' 4"  (1.626 m)   Wt 257 lb (116.6 kg)   LMP 03/30/2019   BMI 44.11 kg/m   Physical Exam Vitals signs reviewed.  Constitutional:      Appearance: She is well-developed.  Neck:     Musculoskeletal: Normal range of motion.  Pulmonary:     Effort: Pulmonary effort is normal.  Genitourinary:    General: Normal vulva.     Pubic Area: No rash.      Labia:        Right: No rash, tenderness or lesion.        Left: No rash, tenderness or lesion.   Musculoskeletal: Normal range of motion.  Skin:    General: Skin is warm and dry.  Neurological:     General: No focal deficit present.     Mental Status: She is alert and oriented to person, place, and time.  Psychiatric:        Mood and Affect: Mood normal.        Behavior: Behavior normal.        Thought Content: Thought content normal.        Judgment: Judgment normal.     Results: Results for orders placed or performed in visit on 04/10/19 (from the past 24 hour(s))  POCT Urinalysis Dipstick     Status: Abnormal   Collection Time: 04/10/19 10:59 AM  Result Value Ref Range   Color, UA yellow    Clarity, UA clear    Glucose, UA Negative Negative   Bilirubin, UA neg    Ketones, UA neg    Spec Grav, UA 1.015 1.010 - 1.025   Blood, UA neg    pH, UA 7.0 5.0 - 8.0   Protein, UA Negative Negative   Urobilinogen, UA negative (A) 0.2 or 1.0 E.U./dL   Nitrite, UA neg    Leukocytes, UA Negative Negative   Appearance     Odor       Assessment/Plan: Vaginal burning - Neg exam, sx only with sex. Question chem vs friction irritant. Use coconut oil as lubricant, dove sens skin for husband, cortisone-10 after sex. F/u prn.   Dyspareunia in female - Deep  sx for 20 yrs. Can f/u for dx lap conf with Dr. Bonney AidStaebler prn.   Dysuria - Neg UA. Due to ext vaginal irritation. - Plan: POCT Urinalysis Dipstick     Return if symptoms worsen or fail to improve.  Helmut MusterAlicia  B. Copland, PA-C 04/10/2019 11:30 AM

## 2021-01-26 ENCOUNTER — Other Ambulatory Visit: Payer: Self-pay | Admitting: Family Medicine

## 2021-01-26 DIAGNOSIS — Z1231 Encounter for screening mammogram for malignant neoplasm of breast: Secondary | ICD-10-CM

## 2021-02-04 ENCOUNTER — Other Ambulatory Visit: Payer: Self-pay

## 2021-02-04 ENCOUNTER — Ambulatory Visit
Admission: RE | Admit: 2021-02-04 | Discharge: 2021-02-04 | Disposition: A | Discharge status: Home / Self Care | Payer: BC Managed Care – PPO | Source: Ambulatory Visit | Attending: Family Medicine | Admitting: Family Medicine

## 2021-02-04 DIAGNOSIS — Z1231 Encounter for screening mammogram for malignant neoplasm of breast: Secondary | ICD-10-CM | POA: Insufficient documentation

## 2021-05-31 IMAGING — MG MM DIGITAL SCREENING BILAT W/ TOMO AND CAD
8 series · 8 of 24 positions shown · non-contrast
Comparison: Previous exam(s).

CLINICAL DATA: Screening.

EXAM:
DIGITAL SCREENING BILATERAL MAMMOGRAM WITH TOMOSYNTHESIS AND CAD
TECHNIQUE: Bilateral screening digital craniocaudal and mediolateral oblique
mammograms were obtained. Bilateral screening digital breast
tomosynthesis was performed. The images were evaluated with
computer-aided detection.

[L MLO synth-2D]
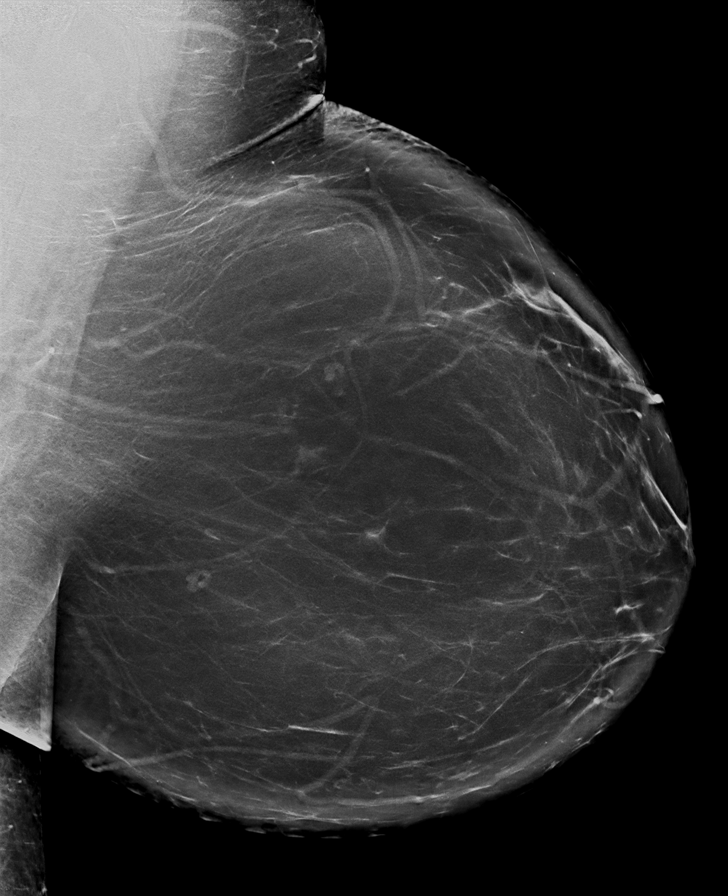

[L CC synth-2D]
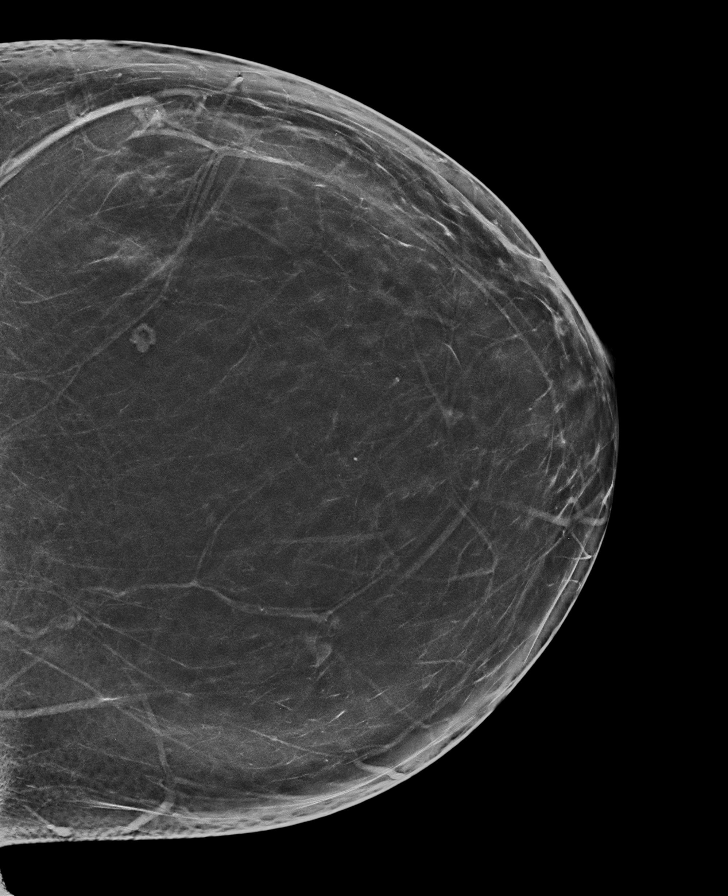

[R CC synth-2D]
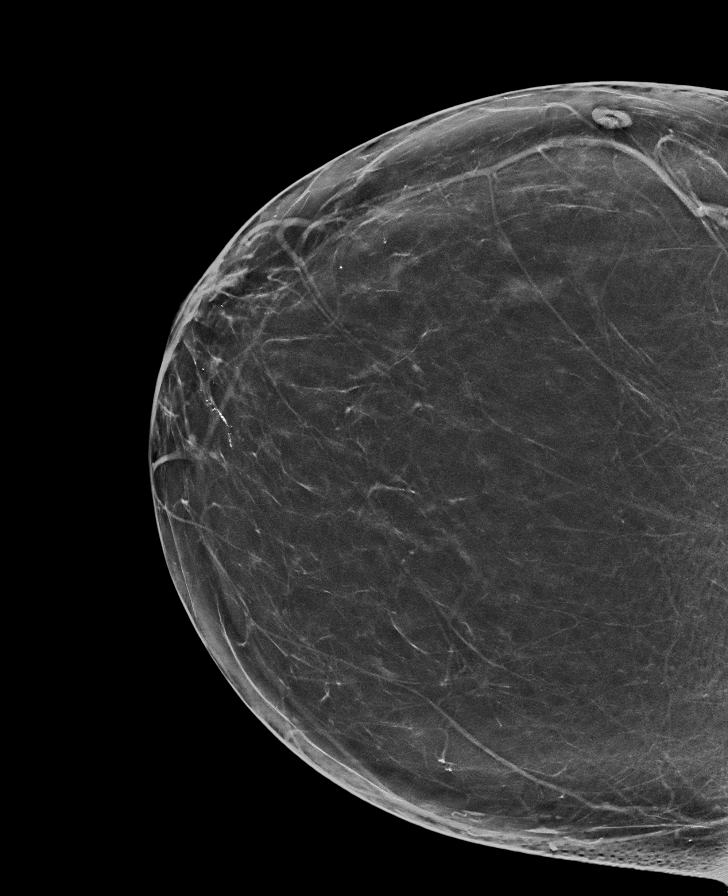

[R MLO synth-2D]
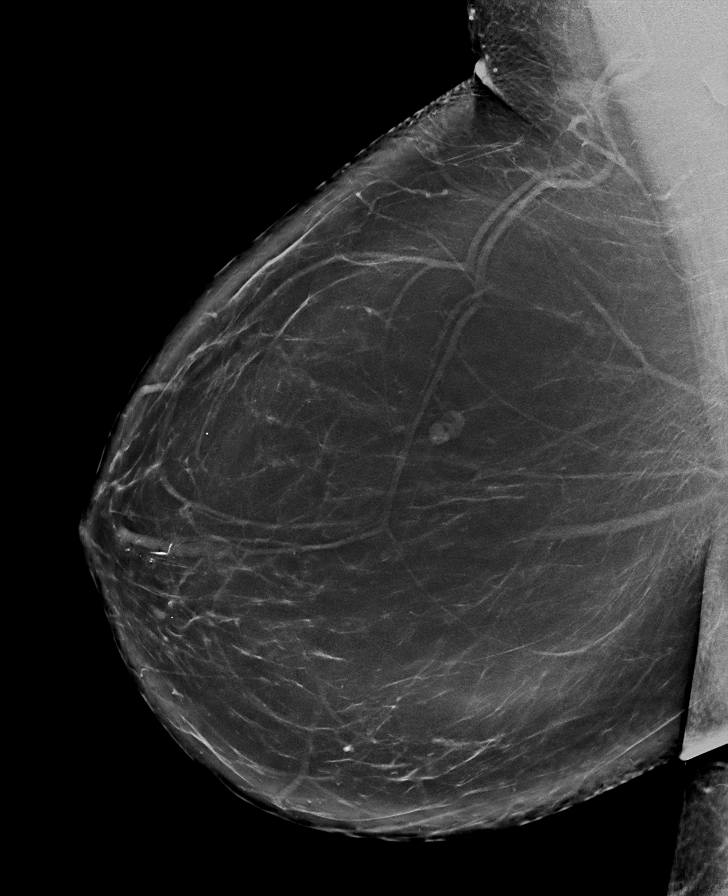

[R MLO tomo · tomo slice 53/104.0]
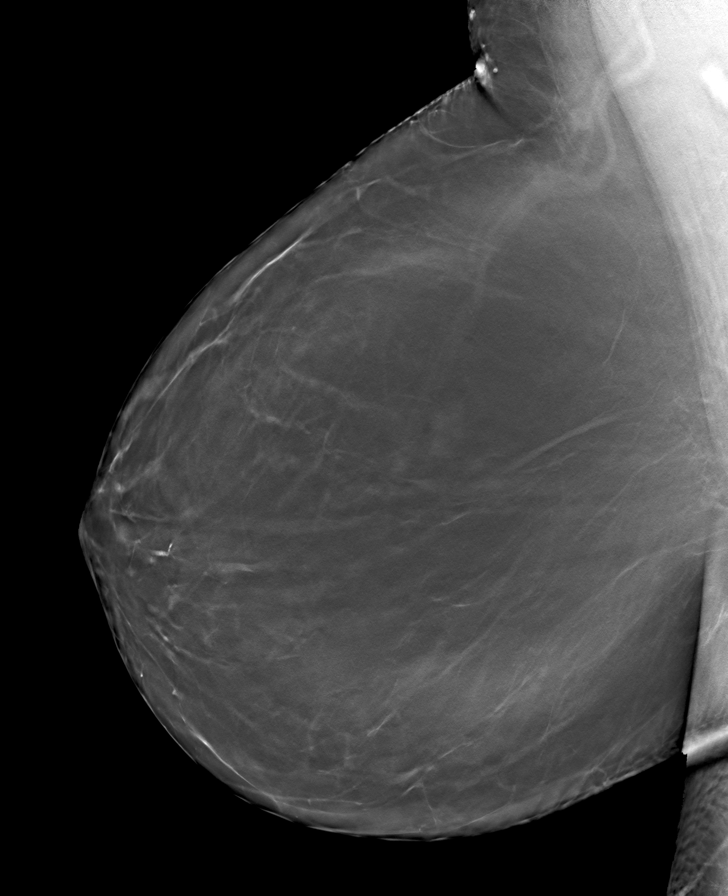

[L MLO tomo · tomo slice 57/113.0]
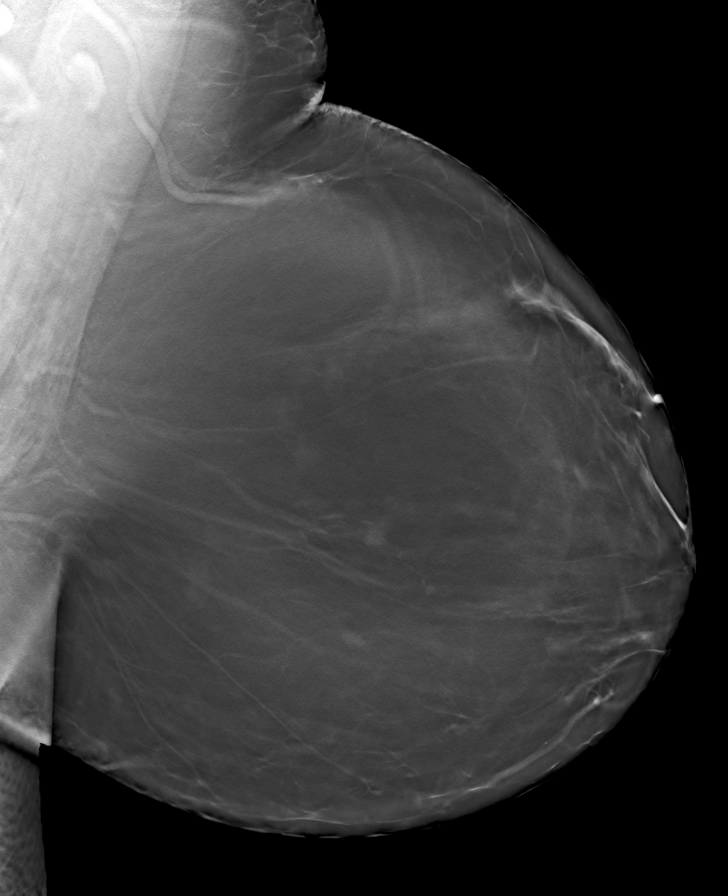

[L CC tomo · tomo slice 43/86.0]
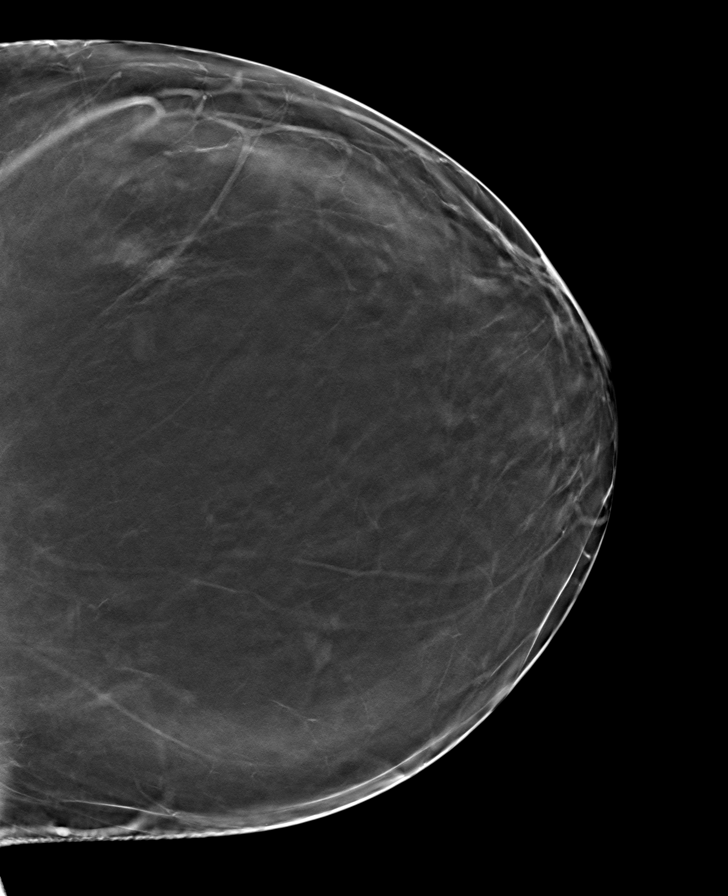

[R CC tomo · tomo slice 41/82.0]
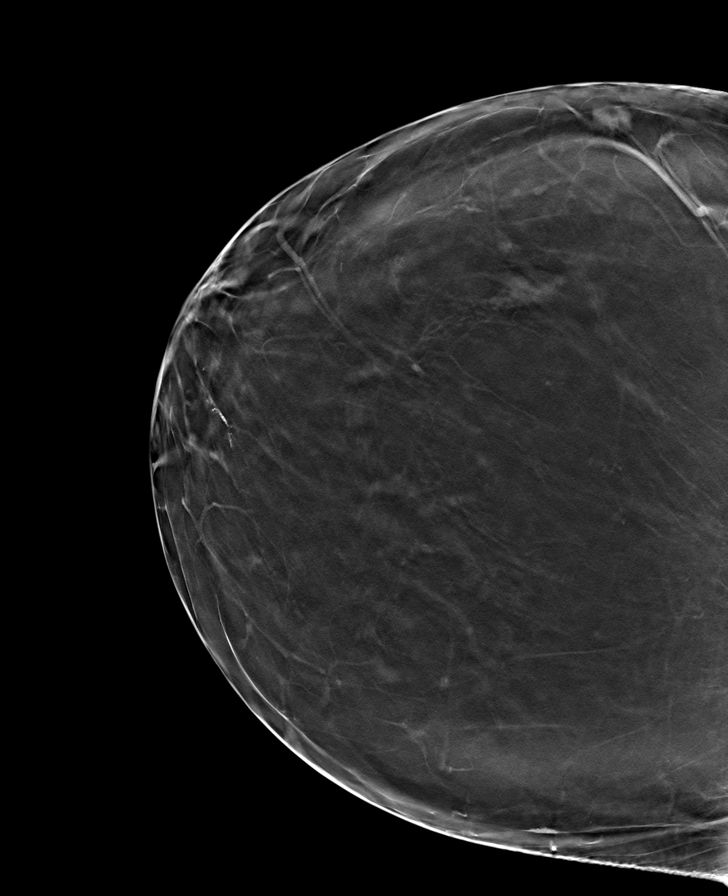

[8 of 24 positions shown; findings below may reference images not displayed]

ACR Breast Density Category b: There are scattered areas of
fibroglandular density.
FINDINGS: There are no findings suspicious for malignancy. The images were
evaluated with computer-aided detection.
IMPRESSION: No mammographic evidence of malignancy. A result letter of this
screening mammogram will be mailed directly to the patient.

RECOMMENDATION:
Screening mammogram in one year. (Code:WJ-I-BG6)

BI-RADS CATEGORY  1: Negative.

## 2021-12-10 ENCOUNTER — Other Ambulatory Visit: Payer: Self-pay

## 2021-12-10 ENCOUNTER — Other Ambulatory Visit: Payer: Self-pay | Admitting: Licensed Practical Nurse

## 2021-12-10 ENCOUNTER — Ambulatory Visit (INDEPENDENT_AMBULATORY_CARE_PROVIDER_SITE_OTHER): Payer: BC Managed Care – PPO | Admitting: Licensed Practical Nurse

## 2021-12-10 ENCOUNTER — Encounter: Payer: Self-pay | Admitting: Licensed Practical Nurse

## 2021-12-10 VITALS — BP 126/84 | Ht 64.0 in | Wt 268.0 lb

## 2021-12-10 DIAGNOSIS — N3945 Continuous leakage: Secondary | ICD-10-CM

## 2021-12-10 DIAGNOSIS — R102 Pelvic and perineal pain unspecified side: Secondary | ICD-10-CM

## 2021-12-10 DIAGNOSIS — N941 Unspecified dyspareunia: Secondary | ICD-10-CM

## 2021-12-10 DIAGNOSIS — Z113 Encounter for screening for infections with a predominantly sexual mode of transmission: Secondary | ICD-10-CM

## 2021-12-10 DIAGNOSIS — B3731 Acute candidiasis of vulva and vagina: Secondary | ICD-10-CM

## 2021-12-10 MED ORDER — FLUCONAZOLE 150 MG PO TABS: 150.0000 mg | ORAL_TABLET | ORAL | 0 refills | Status: AC

## 2021-12-10 NOTE — Progress Notes (Signed)
Obstetrics & Gynecology Office Visit   Chief Complaint:  Chief Complaint  Patient presents with   Pelvic Pain   Vaginitis    History of Present Illness: 46 y.o. [id number] presenting for initial evaluation with this practice of dyspareunia.  Symptoms onset was today and symptoms have been stable.  The patient is not menopausal.  She is not currently on any HRT or hormonal medications.    This morning when she and her husband were trying to have IC, he was not able to enter her.  She immediately felt great pain and he had to stop. This had happen 4 months ago, at that time they did have IC, but it was painful and she continued to have vaginal pain and swelling for a few days.  At that time, she went to an Urgent care, she was told she had trich, although there is no documented results.  When she called that clinic for results, she was told they do not give results over the phone, she does not have a pt portal account for that urgent care either. Her husband went to his provider and was given treatment without being tested. They have been in a monogamous relationship for 20 years. Elizabeth Lucas is here today because she wants definite answers regarding possible STI-she trusts that her husband does not have other partners.  Reports a hx of anxiety, denies any high anxiety currently Denies hx of depression.  Denies any changes to her Vaginal discharge. Does menstruate, her cycles are irregular-she has been worked up for endometriosis in the past-never had the laparoscopic procedure.   Pain is most pronounced with initial penetration.  She denies postcoital spotting.  Last pap smear on NOT on file. .  She denies a history of sexually transmitted infections.  Associated symptoms include none.  She denies recent antibiotic exposure, denies changes in soaps, detergents coinciding with the onset of her symptoms.  She has not previously self treated or been under treatment by another provider for these symptoms.    Reports she and her husband have had regular IC 3-4 times week since that last episode without any issue.   Her husband had an vasectomy for contraception  Review of Systems  Constitutional:  Negative for chills and fever.  Gastrointestinal:  Negative for abdominal pain.  Genitourinary:  Negative for dysuria and urgency.       Leaking urine over the last 2 days  Psychiatric/Behavioral:  Negative for depression.    Past Medical History:  Past Medical History:  Diagnosis Date   Anxiety    Dyspareunia in female    Endometriosis    Fibromyalgia    Menorrhagia    Ovarian cyst 2016   LT    Past Surgical History:  History reviewed. No pertinent surgical history.  Gynecologic History: No LMP recorded.  Obstetric History: G2P2002  Family History:  Family History  Problem Relation Age of Onset   Healthy Mother    Colon cancer Maternal Grandmother    Breast cancer Maternal Grandmother        mat great grandmother   Diabetes type II Maternal Grandfather    Colon cancer Paternal Grandmother     Social History:  Social History   Socioeconomic History   Marital status: Married    Spouse name: Not on file   Number of children: Not on file   Years of education: Not on file   Highest education level: Not on file  Occupational History   Not  on file  Tobacco Use   Smoking status: Never   Smokeless tobacco: Never  Vaping Use   Vaping Use: Never used  Substance and Sexual Activity   Alcohol use: Yes    Alcohol/week: 3.0 standard drinks    Types: 3 Glasses of wine per week   Drug use: No   Sexual activity: Yes    Birth control/protection: Surgical    Comment: vasectomy  Other Topics Concern   Not on file  Social History Narrative   Not on file   Social Determinants of Health   Financial Resource Strain: Not on file  Food Insecurity: Not on file  Transportation Needs: Not on file  Physical Activity: Not on file  Stress: Not on file  Social Connections: Not on  file  Intimate Partner Violence: Not on file    Allergies:  Allergies  Allergen Reactions   Percocet [Oxycodone-Acetaminophen] Nausea And Vomiting    Medications: Prior to Admission medications   Medication Sig Start Date End Date Taking? Authorizing Provider  naproxen (NAPROSYN) 500 MG tablet Take 500 mg by mouth 2 (two) times daily. 11/03/21   [provider]    Physical Exam Blood pressure 126/84, height 5\' 4"  (1.626 m), weight 268 lb (121.6 kg).  No LMP recorded.  General: NAD HEENT: normocephalic, anicteric Genitourinary:  External:Labia and mons pubis red-irritated looking   Normal urethral meatus, normal.  Sensitive to light touch with cotton swab more so on the left inner labia, unable to advance a speculum for exam. Introitus beefy red.   Vagina blindly swept with cotton swab, thick white discharge present.    Rectal: deferred  Lymphatic: no evidence of inguinal lymphadenopathy Extremities: no edema, erythema, or tenderness Psychiatric: mood appropriate, affect full  Wet prep: neg whiff, ph change, clue, trich, positive budding yeast  Assessment: 46 y.o. 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 Pt verbalizes understanding. -RTC PRN    [id number], CNM  Carie Caddy, [facility]  12/10/21  11:47 AM

## 2021-12-12 LAB — URINE CULTURE

## 2021-12-13 LAB — NUSWAB VAGINITIS PLUS (VG+)
Atopobium vaginae: HIGH Score — AB
Candida albicans, NAA: POSITIVE — AB
Candida glabrata, NAA: NEGATIVE
Chlamydia trachomatis, NAA: NEGATIVE
Neisseria gonorrhoeae, NAA: NEGATIVE
Trich vag by NAA: NEGATIVE

## 2021-12-15 ENCOUNTER — Encounter: Payer: Self-pay | Admitting: Licensed Practical Nurse

## 2023-12-28 ENCOUNTER — Other Ambulatory Visit: Payer: Self-pay | Admitting: Family Medicine

## 2023-12-28 DIAGNOSIS — Z1231 Encounter for screening mammogram for malignant neoplasm of breast: Secondary | ICD-10-CM

## 2024-01-03 ENCOUNTER — Ambulatory Visit
Admission: RE | Admit: 2024-01-03 | Discharge: 2024-01-03 | Disposition: A | Discharge status: Home / Self Care | Payer: BC Managed Care – PPO | Source: Ambulatory Visit | Attending: Family Medicine | Admitting: Family Medicine

## 2024-01-03 DIAGNOSIS — Z1231 Encounter for screening mammogram for malignant neoplasm of breast: Secondary | ICD-10-CM | POA: Diagnosis present

## 2024-01-11 ENCOUNTER — Other Ambulatory Visit (HOSPITAL_COMMUNITY)
Admission: RE | Admit: 2024-01-11 | Discharge: 2024-01-11 | Disposition: A | Discharge status: Home / Self Care | Payer: BC Managed Care – PPO | Source: Ambulatory Visit | Attending: Licensed Practical Nurse | Admitting: Licensed Practical Nurse

## 2024-01-11 ENCOUNTER — Ambulatory Visit: Payer: BC Managed Care – PPO | Admitting: Licensed Practical Nurse

## 2024-01-11 VITALS — BP 125/86 | HR 106 | Wt 276.8 lb

## 2024-01-11 DIAGNOSIS — N939 Abnormal uterine and vaginal bleeding, unspecified: Secondary | ICD-10-CM

## 2024-01-11 DIAGNOSIS — N858 Other specified noninflammatory disorders of uterus: Secondary | ICD-10-CM | POA: Diagnosis not present

## 2024-01-11 MED ORDER — NORETHINDRONE 0.35 MG PO TABS: 1.0000 | ORAL_TABLET | Freq: Every day | ORAL | 11 refills | Status: AC

## 2024-01-11 NOTE — Progress Notes (Signed)
HPI:      [name] Lucas is a 48 y.o. [id number] who LMP was Patient's last menstrual period was 12/12/2023 (exact date)., presents today for a problem visit.  She complains of frequent menstrual cycles for the last 1 year. She gets a cycle about every 2 weeks and will bleed for about 10 days, she needs to wear depends, which she changes three times a day. She occasionally passes smalls clots, she has not had any cramping. She is taking an iron supplement as she is worried about anemia. She has mentioned the bleeding to her PCP and was told that she was in her 67's what did she expect, she was also told a long time ago that she probably has endometriosis-she does not want to have the procedure for endo "unless you are going to just take everything out".  Pt reports for the last 15 years her cycles have been irregular, they come every 1 to 2 months, or sometimes twice in a month. Her grandmother and aunt both had hysterectomies at a young age. Her mother was in the 40's when she entered menopause.   The pt started bleeding on January 20 and has been bleeding since. She bled like a period for the first 7 days, got a "break" and then she bled for another 10 days, it stopped and she had IC and stated to bleed within  an 1 hour having IC.   She does have a hx of CHTN, on medications  Denies tobacco or nicotine use   Declines CBC as she has been taking Iron and does feel that she is low.   PMHx: She  has a past medical history of Anxiety, Dyspareunia in female, Endometriosis, Fibromyalgia, Menorrhagia, and Ovarian cyst (2016). Also,  has no past surgical history on file., family history includes Breast cancer in her maternal grandmother; Colon cancer in her maternal grandmother and paternal grandmother; Diabetes type II in her maternal grandfather; Healthy in her mother.,  reports that she has never smoked. She has never used smokeless tobacco. She reports current alcohol use of about 3.0 standard  drinks of alcohol per week. She reports that she does not use drugs.  She  Current Outpatient Medications:    acetaminophen (TYLENOL) 650 MG CR tablet, Take 650 mg by mouth every 8 (eight) hours as needed for pain., Disp: , Rfl:    amLODipine (NORVASC) 10 MG tablet, Take 10 mg by mouth daily., Disp: , Rfl:    famotidine (PEPCID) 20 MG tablet, Take 20 mg by mouth 2 (two) times daily., Disp: , Rfl:    hydrochlorothiazide (HYDRODIURIL) 25 MG tablet, Take 25 mg by mouth daily., Disp: , Rfl:    hydrOXYzine (ATARAX) 25 MG tablet, Take 25 mg by mouth 3 (three) times daily as needed., Disp: , Rfl:    naproxen (NAPROSYN) 500 MG tablet, Take 500 mg by mouth 2 (two) times daily., Disp: , Rfl:    norethindrone (ORTHO MICRONOR) 0.35 MG tablet, Take 1 tablet (0.35 mg total) by mouth daily., Disp: 28 tablet, Rfl: 11  Also, is allergic to percocet [oxycodone-acetaminophen].  ROS see HPI   Objective: BP 125/86 (BP Location: Right Leg, Patient Position: Sitting, Cuff Size: Large)   Pulse (!) 106   Wt 276 lb 12.8 oz (125.6 kg)   LMP 12/12/2023 (Exact Date)   BMI 47.51 kg/m  Physical Exam Constitutional:      Appearance: Normal appearance.  Genitourinary:     Vulva normal.  Genitourinary Comments: Bimanual: difficult d/t habitus, gravid appears to be non gravid size, non tender, no masses. Adnexa, non tender,no masses, not enlarged  Cerivx pink, no lesions, moderate to heavy menses like discharge   Cardiovascular:     Rate and Rhythm: Normal rate.  Pulmonary:     Effort: Pulmonary effort is normal.  Abdominal:     General: Abdomen is flat.     Tenderness: There is no abdominal tenderness.  Neurological:     Mental Status: She is alert.     Endometrial Biopsy Procedure Note  The patient is positioned on the exam table in the dorsal lithotomy position. Bimanual exam confirms uterine position and size. A Graves speculum is placed into the vagina. A single toothed tenaculum is placed onto the  anterior lip of the cervix. The pipette is placed into the endocervical canal and is advanced to the uterine fundus. Using a piston like technique, with vacuum created by withdrawing the stylus, the endometrial specimen is obtained and transferred to the biopsy container. Minimal bleeding is encountered. The procedure is well tolerated.   Uterine Position:mid anterior posterior   Uterine Length:  9  cm   Uterine Specimen: Scant Average Lush  Post procedure instructions are given.  ASSESSMENT/PLAN:   Abnormal uterine bleeding   Problem List Items Addressed This Visit   None Visit Diagnoses       Abnormal uterine bleeding    -  Primary   Relevant Medications   norethindrone (ORTHO MICRONOR) 0.35 MG tablet   Other Relevant Orders   Surgical pathology   US PELVIS TRANSVAGINAL NON-OB (TV ONLY)      Dr Valentino Saxon consulted on plan, present for EMB.   This CNM will be in contact with pt as results come in  Carie Caddy, [place]  [facility]  01/11/24  11:48 AM

## 2024-01-12 LAB — SURGICAL PATHOLOGY

## 2024-01-18 ENCOUNTER — Ambulatory Visit
Admission: RE | Admit: 2024-01-18 | Discharge: 2024-01-18 | Disposition: A | Discharge status: Home / Self Care | Payer: BC Managed Care – PPO | Source: Ambulatory Visit | Attending: Licensed Practical Nurse | Admitting: Licensed Practical Nurse

## 2024-01-18 DIAGNOSIS — N939 Abnormal uterine and vaginal bleeding, unspecified: Secondary | ICD-10-CM

## 2024-01-20 ENCOUNTER — Encounter: Payer: Self-pay | Admitting: Licensed Practical Nurse

## 2024-01-20 NOTE — Telephone Encounter (Signed)
**Note De-identified  [place] Obfuscation** Please advise 

## 2024-01-21 ENCOUNTER — Encounter: Payer: Self-pay | Admitting: Intensive Care

## 2024-01-21 ENCOUNTER — Other Ambulatory Visit: Payer: Self-pay

## 2024-01-21 ENCOUNTER — Telehealth: Payer: Self-pay | Admitting: Licensed Practical Nurse

## 2024-01-21 ENCOUNTER — Emergency Department
Admission: EM | Admit: 2024-01-21 | Discharge: 2024-01-21 | Disposition: A | Discharge status: Home / Self Care | Attending: Emergency Medicine | Admitting: Emergency Medicine

## 2024-01-21 DIAGNOSIS — N939 Abnormal uterine and vaginal bleeding, unspecified: Secondary | ICD-10-CM | POA: Insufficient documentation

## 2024-01-21 DIAGNOSIS — I10 Essential (primary) hypertension: Secondary | ICD-10-CM | POA: Diagnosis not present

## 2024-01-21 DIAGNOSIS — E876 Hypokalemia: Secondary | ICD-10-CM | POA: Diagnosis not present

## 2024-01-21 HISTORY — DX: Depression, unspecified: F32.A

## 2024-01-21 HISTORY — DX: Essential (primary) hypertension: I10

## 2024-01-21 LAB — BASIC METABOLIC PANEL
Anion gap: 11 (ref 5–15)
BUN: <5 mg/dL — ABNORMAL LOW (ref 6–20)
CO2: 25 mmol/L (ref 22–32)
Calcium: 8.7 mg/dL — ABNORMAL LOW (ref 8.9–10.3)
Chloride: 99 mmol/L (ref 98–111)
Creatinine, Ser: 0.68 mg/dL (ref 0.44–1.00)
GFR, Estimated: >60 mL/min (ref >60)
Glucose, Bld: 105 mg/dL — ABNORMAL HIGH (ref 70–99)
Potassium: 2.7 mmol/L — CL (ref 3.5–5.1)
Sodium: 135 mmol/L (ref 135–145)

## 2024-01-21 LAB — URINALYSIS, ROUTINE W REFLEX MICROSCOPIC
Bilirubin Urine: NEGATIVE
Glucose, UA: NEGATIVE mg/dL
Ketones, ur: NEGATIVE mg/dL
Nitrite: NEGATIVE
Protein, ur: NEGATIVE mg/dL
Specific Gravity, Urine: 1.002 — ABNORMAL LOW (ref 1.005–1.030)
pH: 7 (ref 5.0–8.0)

## 2024-01-21 LAB — MAGNESIUM: Magnesium: 1.9 mg/dL (ref 1.7–2.4)

## 2024-01-21 LAB — CBC
HCT: 33.6 % — ABNORMAL LOW (ref 36.0–46.0)
Hemoglobin: 11.3 g/dL — ABNORMAL LOW (ref 12.0–15.0)
MCH: 29.9 pg (ref 26.0–34.0)
MCHC: 33.6 g/dL (ref 30.0–36.0)
MCV: 88.9 fL (ref 80.0–100.0)
Platelets: 338 10*3/uL (ref 150–400)
RBC: 3.78 MIL/uL — ABNORMAL LOW (ref 3.87–5.11)
RDW: 13.6 % (ref 11.5–15.5)
WBC: 11 10*3/uL — ABNORMAL HIGH (ref 4.0–10.5)
nRBC: 0 % (ref 0.0–0.2)

## 2024-01-21 LAB — PREGNANCY, URINE: Preg Test, Ur: NEGATIVE

## 2024-01-21 MED ORDER — POTASSIUM CHLORIDE CRYS ER 20 MEQ PO TBCR
40.0000 meq | EXTENDED_RELEASE_TABLET | Freq: Once | ORAL | Status: AC
  Administered 2024-01-21: 40 meq via ORAL
  Filled 2024-01-21: qty 2

## 2024-01-21 MED ORDER — POTASSIUM CHLORIDE CRYS ER 20 MEQ PO TBCR: 20.0000 meq | EXTENDED_RELEASE_TABLET | Freq: Two times a day (BID) | ORAL | 0 refills | Status: AC

## 2024-01-21 NOTE — Discharge Instructions (Addendum)
 Your evaluated in the ED for vaginal bleeding.  Your lab work is reassuring other than a low potassium of 2.7.  You were given a first dose of potassium in the ED. Potassium pills have been sent to your pharmacy.  Take as instructed.  Please follow-up with your OBGYN in 5 days for repeat labs to check your potassium and further evaluation of your abnormal uterine bleeding.  Please give the contraception started by her OB/GYN several weeks in your body before discontinuing.  Sometimes it takes time for this medication to work before improvement.

## 2024-01-21 NOTE — ED Notes (Signed)
 PA Myah notified of critical value K

## 2024-01-21 NOTE — ED Provider Notes (Signed)
 [facility] Emergency Department Provider Note     Event Date/Time   First MD Initiated Contact with Patient 01/21/24 1235     (approximate)   History   Vaginal Bleeding   HPI  Elizabeth Lucas is a 48 y.o. female with a history of HTN, menorrhagia and endometriosis presents to the ED for evaluation of vaginal bleeding x 2 months.  Has a history of irregular menstrual cycles.  She has been followed by her OB/GYN for abnormal uterine bleeding.  She reports she wears depends that she has to frequently change throughout the day.  She reports she was just placed on OCP pills and she is 9 days in for this bleeding.  Patient reports bleeding is worse after intercourse.  She denies any pain at this time.  Chart reviewed.  Ultrasound performed on 02/26 is normal.  Left ovarian functional cyst noted.  Endometrial biopsy performed 01/11/24 - negative for polyp, tatypia, hyperplasia and carcinoma     Physical Exam   Triage Vital Signs: ED Triage Vitals  Encounter Vitals Group     BP 01/21/24 1153 (!) 167/91     Systolic BP Percentile --      Diastolic BP Percentile --      Pulse Rate 01/21/24 1153 (!) 108     Resp 01/21/24 1153 16     Temp 01/21/24 1153 99 F (37.2 C)     Temp Source 01/21/24 1153 Oral     SpO2 01/21/24 1153 100 %     Weight 01/21/24 1154 275 lb (124.7 kg)     Height 01/21/24 1154 5\' 4"  (1.626 m)     Head Circumference --      Peak Flow --      Pain Score 01/21/24 1154 0     Pain Loc --      Pain Education --      Exclude from Growth Chart --     Most recent vital signs: Vitals:   01/21/24 1153  BP: (!) 167/91  Pulse: (!) 108  Resp: 16  Temp: 99 F (37.2 C)  SpO2: 100%    General Awake, no distress.  HEENT NCAT. PERRL. EOMI. No rhinorrhea. Mucous membranes are moist.  CV:  Good peripheral perfusion.  RESP:  Normal effort.  ABD:  No distention.  Soft nontender.    ED Results / Procedures / Treatments   Labs (all  labs ordered are listed, but only abnormal results are displayed) Labs Reviewed  CBC - Abnormal; Notable for the following components:      Result Value   WBC 11.0 (*)    RBC 3.78 (*)    Hemoglobin 11.3 (*)    HCT 33.6 (*)    All other components within normal limits  BASIC METABOLIC PANEL - Abnormal; Notable for the following components:   Potassium 2.7 (*)    Glucose, Bld 105 (*)    BUN <5 (*)    Calcium 8.7 (*)    All other components within normal limits  URINALYSIS, ROUTINE W REFLEX MICROSCOPIC - Abnormal; Notable for the following components:   Color, Urine STRAW (*)    APPearance CLEAR (*)    Specific Gravity, Urine 1.002 (*)    Hgb urine dipstick LARGE (*)    Leukocytes,Ua TRACE (*)    Bacteria, UA MANY (*)    All other components within normal limits  MAGNESIUM  PREGNANCY, URINE   No results found.  PROCEDURES:  Critical Care performed:  No  Procedures   MEDICATIONS ORDERED IN ED: Medications  potassium chloride SA (KLOR-CON M) CR tablet 40 mEq (40 mEq Oral Given 01/21/24 1422)     IMPRESSION / MDM / ASSESSMENT AND PLAN / ED COURSE  I reviewed the triage vital signs and the nursing notes.                               48 y.o. female presents to the emergency department for evaluation and treatment of vaginal bleeding. See HPI for further details.   Differential diagnosis includes, but is not limited to ectopic pregnancy, vaginal/cervical trauma, carcinoma, abnormal uterine bleeding  Patient is alert and oriented.  She is hemodynamically stable.  Mild tachycardic at 108 bpm however this is consistent with her baseline.    physical exam findings are unremarkable.  CBC reveals mildly low Hgb.  CMP reveals potassium at 2.7.  P.o. potassium given in the ED.  Magnesium is normal.   Education is provided on oral contraception and giving time for this medication to work.  Prescribed potassium pills outpatient.  Patient is encouraged to follow-up with her  PCP/OBGYN for repeat labs.  Otherwise I do believe patient is in stable condition for discharge home with OB/GYN follow-up.  ED return cautions discussed.  Patient verbalized understanding.   FINAL CLINICAL IMPRESSION(S) / ED DIAGNOSES   Final diagnoses:  Abnormal uterine bleeding (AUB)  Hypokalemia    Rx / DC Orders   ED Discharge Orders          Ordered    potassium chloride SA (KLOR-CON M) 20 MEQ tablet  2 times daily        01/21/24 1510             Note:  This document was prepared using Dragon voice recognition software and may include unintentional dictation errors.    Romeo Apple, Rogan Ecklund A, PA-C 01/21/24 1900    Sharyn Creamer, MD 01/21/24 [phone]

## 2024-01-21 NOTE — Telephone Encounter (Signed)
 Pt seen for AUB on 2/19. Reports she has changed her Depends twice since 6 am. She is passing large clots. She does feel exhausted and a little light headed denies SOB. She has been taking POP as prescribed, she feels the bleeding has gotten worse since her visit.  Reviewed EMB and US findings all WNL  Dicussed stopping and POP and trying another medication. She is not a candidate for estrogen (CHTN, obese, and >48y/o). Could try Provera.  Given that her bleeding has increased and pt has symptoms recommend pt go to the ED for evaluation. Anticipatory guidance given.  Carie Caddy, CNM   [facility]  01/21/2024 11:05 AM

## 2024-01-21 NOTE — ED Triage Notes (Signed)
 C/o menstruating since December 12, 2023. Reports medium to heavy flow. Started BC last week and reports more clots since. C/o cramping since starting BC and fatigue.

## 2024-03-02 ENCOUNTER — Encounter: Payer: Self-pay | Admitting: Licensed Practical Nurse
